# Patient Record
Sex: Female | Born: 1980 | Race: White | Hispanic: No | Marital: Single | Smoking: Never smoker
Health system: Southern US, Community
[De-identification: ages and names within clinical notes are randomized; demographics above are authoritative.]

## PROBLEM LIST (undated history)

## (undated) DIAGNOSIS — E7800 Pure hypercholesterolemia, unspecified: Secondary | ICD-10-CM

## (undated) DIAGNOSIS — I10 Essential (primary) hypertension: Secondary | ICD-10-CM

## (undated) DIAGNOSIS — E119 Type 2 diabetes mellitus without complications: Secondary | ICD-10-CM

## (undated) HISTORY — PX: CHOLECYSTECTOMY: SHX55

## (undated) HISTORY — PX: ABDOMINAL HYSTERECTOMY: SHX81

## (undated) HISTORY — PX: TONSILLECTOMY: SUR1361

## (undated) HISTORY — PX: APPENDECTOMY: SHX54

---

## 2015-09-21 ENCOUNTER — Emergency Department: Payer: Medicaid - Out of State

## 2015-09-21 ENCOUNTER — Emergency Department
Admission: EM | Admit: 2015-09-21 | Discharge: 2015-09-21 | Disposition: A | Discharge status: Home / Self Care | Payer: Medicaid - Out of State | Attending: Emergency Medicine | Admitting: Emergency Medicine

## 2015-09-21 ENCOUNTER — Encounter: Payer: Self-pay | Admitting: Emergency Medicine

## 2015-09-21 DIAGNOSIS — M549 Dorsalgia, unspecified: Secondary | ICD-10-CM | POA: Diagnosis present

## 2015-09-21 DIAGNOSIS — M79671 Pain in right foot: Secondary | ICD-10-CM | POA: Diagnosis not present

## 2015-09-21 DIAGNOSIS — I10 Essential (primary) hypertension: Secondary | ICD-10-CM | POA: Insufficient documentation

## 2015-09-21 DIAGNOSIS — M5410 Radiculopathy, site unspecified: Secondary | ICD-10-CM | POA: Diagnosis not present

## 2015-09-21 HISTORY — DX: Essential (primary) hypertension: I10

## 2015-09-21 LAB — URINALYSIS COMPLETE WITH MICROSCOPIC (ARMC ONLY)
Bilirubin Urine: NEGATIVE
GLUCOSE, UA: NEGATIVE mg/dL
Hgb urine dipstick: NEGATIVE
Ketones, ur: NEGATIVE mg/dL
Leukocytes, UA: NEGATIVE
Nitrite: NEGATIVE
Protein, ur: NEGATIVE mg/dL
Specific Gravity, Urine: 1.017 (ref 1.005–1.030)
pH: 7 (ref 5.0–8.0)

## 2015-09-21 MED ORDER — METHYLPREDNISOLONE 4 MG PO TBPK: ORAL_TABLET | ORAL | Status: AC

## 2015-09-21 MED ORDER — DEXAMETHASONE SODIUM PHOSPHATE 10 MG/ML IJ SOLN
10.0000 mg | Freq: Once | INTRAMUSCULAR | Status: AC
  Administered 2015-09-21: 10 mg via INTRAMUSCULAR
  Filled 2015-09-21: qty 1

## 2015-09-21 MED ORDER — METHOCARBAMOL 500 MG PO TABS
1000.0000 mg | ORAL_TABLET | Freq: Once | ORAL | Status: AC
  Administered 2015-09-21: 1000 mg via ORAL
  Filled 2015-09-21: qty 2

## 2015-09-21 MED ORDER — METHOCARBAMOL 750 MG PO TABS: 1500.0000 mg | ORAL_TABLET | Freq: Four times a day (QID) | ORAL | Status: AC

## 2015-09-21 MED ORDER — TRAMADOL HCL 50 MG PO TABS
50.0000 mg | ORAL_TABLET | Freq: Once | ORAL | Status: AC
  Administered 2015-09-21: 50 mg via ORAL
  Filled 2015-09-21: qty 1

## 2015-09-21 MED ORDER — TRAMADOL HCL 50 MG PO TABS: 50.0000 mg | ORAL_TABLET | Freq: Four times a day (QID) | ORAL | Status: DC | PRN

## 2015-09-21 NOTE — ED Provider Notes (Signed)
[facility] Emergency Department Provider Note   ____________________________________________  Time seen: Approximately 5:55 PM  I have reviewed the triage vital signs and the nursing notes.   HISTORY  Chief Complaint Back Pain    HPI Brenda Hansen is a 35 y.o. female patient complaining of Acute onset of radicular right back pain to the right lower extremity. Patient denies any provocative incident for this complaint. Patient denies any bladder or bowel dysfunction. Patient also complaining of right dorsal foot pain. Patient states she sprained her foot one month ago but the pain is not resolved. Patient's states x-rays taken of the right foot one month ago but it was perform in [place]. Patient rates overall pain as a 9/10. No palliative measures taken for this complaint.  Past Medical History  Diagnosis Date  . Hypertension     There are no active problems to display for this patient.   Past Surgical History  Procedure Laterality Date  . Abdominal hysterectomy    . Cholecystectomy    . Appendectomy      Current Outpatient Rx  Name  Route  Sig  Dispense  Refill  . methocarbamol (ROBAXIN-750) 750 MG tablet   Oral   Take 2 tablets (1,500 mg total) by mouth 4 (four) times daily.   40 tablet   0   . methylPREDNISolone (MEDROL DOSEPAK) 4 MG TBPK tablet      Take Tapered dose as directed   21 tablet   0   . traMADol (ULTRAM) 50 MG tablet   Oral   Take 1 tablet (50 mg total) by mouth every 6 (six) hours as needed.   20 tablet   0     Allergies Iodine; Nsaids; and Penicillins  No family history on file.  Social History Social History  Substance Use Topics  . Smoking status: Never Smoker   . Smokeless tobacco: None  . Alcohol Use: No    Review of Systems Constitutional: No fever/chills Eyes: No visual changes. ENT: No sore throat. Cardiovascular: Denies chest pain. Respiratory: Denies shortness of  breath. Gastrointestinal: No abdominal pain.  No nausea, no vomiting.  No diarrhea.  No constipation. Genitourinary: Negative for dysuria. Musculoskeletal:Positive for back pain and right dorsal foot pain Skin: Negative for rash. Neurological: Negative for headaches, focal weakness or numbness. Endocrine:Hypertension   ____________________________________________   PHYSICAL EXAM:  VITAL SIGNS: ED Triage Vitals  Enc Vitals Group     BP 09/21/15 1720 141/79 mmHg     Pulse Rate 09/21/15 1720 104     Resp 09/21/15 1720 20     Temp 09/21/15 1720 98.1 F (36.7 C)     Temp Source 09/21/15 1720 Oral     SpO2 09/21/15 1720 100 %     Weight 09/21/15 1720 210 lb (95.255 kg)     Height 09/21/15 1720  (1.676 m)     Head Cir --      Peak Flow --      Pain Score 09/21/15 1717 9     Pain Loc --      Pain Edu? --      Excl. in GC? --     Constitutional: Alert and oriented. Well appearing and in no acute distress. Eyes: Conjunctivae are normal. PERRL. EOMI. Head: Atraumatic. Nose: No congestion/rhinnorhea. Mouth/Throat: Mucous membranes are moist.  Oropharynx non-erythematous. Neck: No stridor.  No cervical spine tenderness to palpation. Hematological/Lymphatic/Immunilogical: No cervical lymphadenopathy. Cardiovascular:Mildly tachycardic. Grossly normal heart sounds.  Good peripheral  circulation. Respiratory: Normal respiratory effort.  No retractions. Lungs CTAB. Gastrointestinal: Soft and nontender. No distention. No abdominal bruits. No CVA tenderness. Musculoskeletal: No obvious spinal deformity. Patient is moderate guarding at L3-S1. Patient decreased range of motion with flexion. Patient has a positive right leg test. Examination of the right foot shows no obvious deformity. Patient has a very high arch. Patient has some moderate guarding palpation dorsal aspect of the right foot. There is no edema or erythema. Neurologic:  Normal speech and language. No gross focal neurologic  deficits are appreciated. No gait instability. Skin:  Skin is warm, dry and intact. No rash noted. Psychiatric: Mood and affect are normal. Speech and behavior are normal.  ____________________________________________   LABS (all labs ordered are listed, but only abnormal results are displayed)  Labs Reviewed  URINALYSIS COMPLETEWITH MICROSCOPIC ([facility] ONLY) - Abnormal; Notable for the following:    Color, Urine YELLOW (*)    APPearance CLOUDY (*)    Bacteria, UA RARE (*)    Squamous Epithelial / LPF 6-30 (*)    All other components within normal limits   ____________________________________________  EKG   ____________________________________________  RADIOLOGY  No acute findings on lumbar x-ray and right foot x-ray. ____________________________________________   PROCEDURES  Procedure(s) performed: None  Critical Care performed: No  ____________________________________________   INITIAL IMPRESSION / ASSESSMENT AND PLAN / ED COURSE  Pertinent labs & imaging results that were available during my care of the patient were reviewed by me and considered in my medical decision making (see chart for details).  Radicular back pain and right foot pain. Discussed x-ray findings and lab results with patient. Patient advised follow orthopedics for further evaluation and treatment. Patient given discharge Instructions. Patient given prescription for tramadol, Robaxin, and Medrol Dosepak. ____________________________________________   FINAL CLINICAL IMPRESSION(S) / ED DIAGNOSES  Final diagnoses:  Radicular low back pain  Foot pain, right      NEW MEDICATIONS STARTED DURING THIS VISIT:  New Prescriptions   METHOCARBAMOL (ROBAXIN-750) 750 MG TABLET    Take 2 tablets (1,500 mg total) by mouth 4 (four) times daily.   METHYLPREDNISOLONE (MEDROL DOSEPAK) 4 MG TBPK TABLET    Take Tapered dose as directed   TRAMADOL (ULTRAM) 50 MG TABLET    Take 1 tablet (50 mg total) by mouth  every 6 (six) hours as needed.     Note:  This document was prepared using Dragon voice recognition software and may include unintentional dictation errors.    Joni Reiningonald K Ione [place], PA-C 09/21/15 1940  Sharyn CreamerMark Quale, MD 09/22/15 0010

## 2015-09-21 NOTE — ED Notes (Signed)
Right sided lower back pain which radiates into right leg /foot   Denies any injury  Denies any urinary sx's

## 2015-10-02 ENCOUNTER — Emergency Department (HOSPITAL_COMMUNITY): Payer: Self-pay

## 2015-10-02 ENCOUNTER — Encounter (HOSPITAL_COMMUNITY): Payer: Self-pay | Admitting: Emergency Medicine

## 2015-10-02 ENCOUNTER — Emergency Department (HOSPITAL_COMMUNITY)
Admission: EM | Admit: 2015-10-02 | Discharge: 2015-10-03 | Disposition: A | Discharge status: Home / Self Care | Payer: Self-pay | Attending: Emergency Medicine | Admitting: Emergency Medicine

## 2015-10-02 DIAGNOSIS — Z79899 Other long term (current) drug therapy: Secondary | ICD-10-CM | POA: Insufficient documentation

## 2015-10-02 DIAGNOSIS — Z791 Long term (current) use of non-steroidal anti-inflammatories (NSAID): Secondary | ICD-10-CM | POA: Insufficient documentation

## 2015-10-02 DIAGNOSIS — I10 Essential (primary) hypertension: Secondary | ICD-10-CM | POA: Insufficient documentation

## 2015-10-02 DIAGNOSIS — M545 Low back pain: Secondary | ICD-10-CM | POA: Insufficient documentation

## 2015-10-02 DIAGNOSIS — W19XXXA Unspecified fall, initial encounter: Secondary | ICD-10-CM

## 2015-10-02 DIAGNOSIS — Y939 Activity, unspecified: Secondary | ICD-10-CM | POA: Insufficient documentation

## 2015-10-02 DIAGNOSIS — Y9289 Other specified places as the place of occurrence of the external cause: Secondary | ICD-10-CM | POA: Insufficient documentation

## 2015-10-02 DIAGNOSIS — M542 Cervicalgia: Secondary | ICD-10-CM | POA: Insufficient documentation

## 2015-10-02 DIAGNOSIS — W1789XA Other fall from one level to another, initial encounter: Secondary | ICD-10-CM | POA: Insufficient documentation

## 2015-10-02 DIAGNOSIS — Y999 Unspecified external cause status: Secondary | ICD-10-CM | POA: Insufficient documentation

## 2015-10-02 MED ORDER — HYDROCODONE-ACETAMINOPHEN 5-325 MG PO TABS
1.0000 | ORAL_TABLET | Freq: Once | ORAL | Status: AC
  Administered 2015-10-02: 1 via ORAL
  Filled 2015-10-02: qty 1

## 2015-10-02 NOTE — ED Notes (Addendum)
Pt fell from building site this afternoon. Landed on concrete. Denies LOC or hitting head. Landed on R side of back, R arm, and R leg. Reports difficulty breathing at times. Pain with movement, no obvious deformities.

## 2015-10-02 NOTE — ED Provider Notes (Signed)
CSN: [id number]     Arrival date & time 10/02/15  1808 History   First MD Initiated Contact with Patient 10/02/15 2052     Chief Complaint  Patient presents with  . Fall     (Consider location/radiation/quality/duration/timing/severity/associated sxs/prior Treatment) HPI  Larey Seat 10-12 feet couple hours prior to arrival landing on back and hit her head and is not complaining of back pain head pain and neck pain. No loss of consciousness. No Other associated symptoms at this time.  History reviewed. No pertinent past medical history. History reviewed. No pertinent past surgical history. History reviewed. No pertinent family history. Social History  Substance Use Topics  . Smoking status: Never Smoker   . Smokeless tobacco: None  . Alcohol Use: No   OB History    No data available     Review of Systems  Constitutional: Negative for fever.  HENT: Negative for congestion and facial swelling.   Eyes: Negative for discharge and redness.  Respiratory: Negative for cough and shortness of breath.   Cardiovascular: Negative for chest pain.  Gastrointestinal: Negative for abdominal pain and abdominal distention.  Endocrine: Negative for polydipsia.  Genitourinary: Negative for dysuria.  Musculoskeletal: Positive for back pain and neck pain.  Skin: Negative for wound.  Neurological: Positive for headaches.  All other systems reviewed and are negative.     Allergies  Iodine; Nsaids; and Penicillins  Home Medications   Prior to Admission medications   Medication Sig Start Date End Date Taking? Authorizing Provider  acetaminophen (TYLENOL) 500 MG tablet Take 1,000 mg by mouth every 6 (six) hours as needed for moderate pain.   Yes Historical Provider, MD  pantoprazole (PROTONIX) 40 MG tablet Take 40 mg by mouth daily.   Yes Historical Provider, MD  PROVENTIL HFA 108 (90 Base) MCG/ACT inhaler Inhale 2 puffs into the lungs every 4 (four) hours as needed for wheezing or shortness of  breath.  09/16/15  Yes Historical Provider, MD  QVAR 40 MCG/ACT inhaler Inhale 1 puff into the lungs 2 (two) times daily as needed (shortness of breathe.).  09/16/15  Yes Historical Provider, MD   BP 131/93 mmHg  Pulse 78  Temp(Src) 97.9 F (36.6 C) (Oral)  Resp 20  SpO2 100% Physical Exam  Constitutional: She is oriented to person, place, and time. She appears well-developed and well-nourished.  HENT:  Head: Normocephalic and atraumatic.  Eyes: Pupils are equal, round, and reactive to light.  Neck: Normal range of motion.  Cardiovascular: Normal rate and regular rhythm.   Pulmonary/Chest: No stridor. No respiratory distress.  Abdominal: Soft. She exhibits no distension. There is no tenderness.  Musculoskeletal: Normal range of motion. She exhibits no edema or tenderness.  Neurological: She is alert and oriented to person, place, and time. No cranial nerve deficit. Coordination normal.  Skin: Skin is warm and dry.  Nursing note and vitals reviewed.   ED Course  Procedures (including critical care time) Labs Review Labs Reviewed - No data to display  Imaging Review Dg Thoracic Spine 2 View  10/02/2015  CLINICAL DATA:  Patient fell two stories at a construction site, states she landed on her back, upper back pain that radiates to lower back and hips EXAM: THORACIC SPINE 2 VIEWS COMPARISON:  None. FINDINGS: No fracture. No spondylolisthesis. Minor endplate spurring along the mid to lower thoracic spine. No other degenerative change. No bone lesion. Normal soft tissues. IMPRESSION: No fracture or acute finding. Electronically Signed   By: Renard Hamper.D.  On: 10/02/2015 21:43   Dg Lumbar Spine Complete  10/02/2015  CLINICAL DATA:  Patient fell two stories at a construction site, states she landed on her back, upper back pain that radiates to lower back and hips EXAM: LUMBAR SPINE - COMPLETE 4+ VIEW COMPARISON:  None. FINDINGS: There is no evidence of lumbar spine fracture. Alignment is  normal. Mild narrowing of the L4-5 interspace. Cholecystectomy clips. Bilateral pelvic phleboliths. IMPRESSION: 1. Negative for fracture or dislocation. 2. Narrowing of the L4-5 interspace, of uncertain chronicity. Electronically Signed   By: Corlis Leak M.D.   On: 10/02/2015 21:43   Dg Pelvis 1-2 Views  10/02/2015  CLINICAL DATA:  Patient fell two stories at a construction site, states she landed on her back, upper back pain that radiates to lower back and hips EXAM: PELVIS - 1-2 VIEW COMPARISON:  None. FINDINGS: There is no evidence of pelvic fracture or diastasis. No pelvic bone lesions are seen. IMPRESSION: Negative. Electronically Signed   By: Amie Portland M.D.   On: 10/02/2015 21:44   Ct Head Wo Contrast  10/02/2015  CLINICAL DATA:  Pt fell from building site this afternoon. Landed on concrete. Denies LOC or hitting head. Landed on R side of back, R arm, and R leg. Reports difficulty breathing at times. Pain with movement, no obvious deformities. Pt unable to remove tongue ring EXAM: CT HEAD WITHOUT CONTRAST CT CERVICAL SPINE WITHOUT CONTRAST TECHNIQUE: Multidetector CT imaging of the head and cervical spine was performed following the standard protocol without intravenous contrast. Multiplanar CT image reconstructions of the cervical spine were also generated. COMPARISON:  None. FINDINGS: CT HEAD FINDINGS Brain: No evidence of acute infarction, hemorrhage, extra-axial collection, ventriculomegaly, or mass effect. Vascular: No hyperdense vessel or unexpected calcification. Skull: Negative for fracture or focal lesion. Sinuses/Orbits: No acute findings. Other: None. CT CERVICAL SPINE FINDINGS Negative for fracture. Vertebral body and disc height maintained throughout. Calcifications in the posterior longitudinal ligament at C3-4 and C4-5. No osseous foraminal stenosis. No prevertebral soft tissue swelling. Normal alignment. Visualized lung apices clear. IMPRESSION: 1. Negative for bleed or other acute  intracranial process. 2. No acute cervical abnormality. Electronically Signed   By: Corlis Leak M.D.   On: 10/02/2015 22:04   Ct Cervical Spine Wo Contrast  10/02/2015  CLINICAL DATA:  Pt fell from building site this afternoon. Landed on concrete. Denies LOC or hitting head. Landed on R side of back, R arm, and R leg. Reports difficulty breathing at times. Pain with movement, no obvious deformities. Pt unable to remove tongue ring EXAM: CT HEAD WITHOUT CONTRAST CT CERVICAL SPINE WITHOUT CONTRAST TECHNIQUE: Multidetector CT imaging of the head and cervical spine was performed following the standard protocol without intravenous contrast. Multiplanar CT image reconstructions of the cervical spine were also generated. COMPARISON:  None. FINDINGS: CT HEAD FINDINGS Brain: No evidence of acute infarction, hemorrhage, extra-axial collection, ventriculomegaly, or mass effect. Vascular: No hyperdense vessel or unexpected calcification. Skull: Negative for fracture or focal lesion. Sinuses/Orbits: No acute findings. Other: None. CT CERVICAL SPINE FINDINGS Negative for fracture. Vertebral body and disc height maintained throughout. Calcifications in the posterior longitudinal ligament at C3-4 and C4-5. No osseous foraminal stenosis. No prevertebral soft tissue swelling. Normal alignment. Visualized lung apices clear. IMPRESSION: 1. Negative for bleed or other acute intracranial process. 2. No acute cervical abnormality. Electronically Signed   By: Corlis Leak M.D.   On: 10/02/2015 22:04   I have personally reviewed and evaluated these images and lab results  as part of my medical decision-making.   EKG Interpretation None      MDM   Final diagnoses:  Fall, initial encounter    89 female with a fall without any obvious injuries on exam or imaging.   Plan for DC on NSAIDs.  New Prescriptions: Discharge Medication List as of 10/02/2015 11:13 PM       I have personally and contemperaneously reviewed labs and  imaging and used in my decision making as above.   A medical screening exam was performed and I feel the patient has had an appropriate workup for their chief complaint at this time and likelihood of emergent condition existing is low and thus workup can continue on an outpatient basis.. Their vital signs are stable. They have been counseled on decision, discharge, follow up and which symptoms necessitate immediate return to the emergency department.  They verbally stated understanding and agreement with plan and discharged in stable condition.      Marily MemosJason Joleigh Mineau, MD 10/03/15 [phone]

## 2015-10-03 ENCOUNTER — Encounter: Payer: Self-pay | Admitting: Emergency Medicine

## 2015-10-09 ENCOUNTER — Encounter (HOSPITAL_COMMUNITY): Payer: Self-pay | Admitting: Emergency Medicine

## 2015-10-09 ENCOUNTER — Emergency Department (HOSPITAL_COMMUNITY)
Admission: EM | Admit: 2015-10-09 | Discharge: 2015-10-10 | Disposition: A | Discharge status: Home / Self Care | Payer: Medicaid - Out of State | Attending: Emergency Medicine | Admitting: Emergency Medicine

## 2015-10-09 DIAGNOSIS — R1012 Left upper quadrant pain: Secondary | ICD-10-CM | POA: Diagnosis not present

## 2015-10-09 DIAGNOSIS — Z79899 Other long term (current) drug therapy: Secondary | ICD-10-CM | POA: Diagnosis not present

## 2015-10-09 DIAGNOSIS — I10 Essential (primary) hypertension: Secondary | ICD-10-CM | POA: Diagnosis not present

## 2015-10-09 DIAGNOSIS — R109 Unspecified abdominal pain: Secondary | ICD-10-CM | POA: Diagnosis present

## 2015-10-09 DIAGNOSIS — E119 Type 2 diabetes mellitus without complications: Secondary | ICD-10-CM | POA: Insufficient documentation

## 2015-10-09 HISTORY — DX: Type 2 diabetes mellitus without complications: E11.9

## 2015-10-09 LAB — CBC
HCT: 38.2 % (ref 36.0–46.0)
Hemoglobin: 12.2 g/dL (ref 12.0–15.0)
MCH: 29 pg (ref 26.0–34.0)
MCHC: 31.9 g/dL (ref 30.0–36.0)
MCV: 91 fL (ref 78.0–100.0)
Platelets: 427 10*3/uL — ABNORMAL HIGH (ref 150–400)
RBC: 4.2 MIL/uL (ref 3.87–5.11)
RDW: 13.2 % (ref 11.5–15.5)
WBC: 12.1 10*3/uL — AB (ref 4.0–10.5)

## 2015-10-09 LAB — COMPREHENSIVE METABOLIC PANEL
ALBUMIN: 3.9 g/dL (ref 3.5–5.0)
ALK PHOS: 75 U/L (ref 38–126)
ALT: 20 U/L (ref 14–54)
ANION GAP: 7 (ref 5–15)
AST: 23 U/L (ref 15–41)
BILIRUBIN TOTAL: 0.4 mg/dL (ref 0.3–1.2)
BUN: 19 mg/dL (ref 6–20)
CALCIUM: 9.3 mg/dL (ref 8.9–10.3)
CO2: 23 mmol/L (ref 22–32)
Chloride: 108 mmol/L (ref 101–111)
Creatinine, Ser: 1.1 mg/dL — ABNORMAL HIGH (ref 0.44–1.00)
GFR calc Af Amer: >60 mL/min (ref >60)
GLUCOSE: 125 mg/dL — AB (ref 65–99)
Potassium: 3.5 mmol/L (ref 3.5–5.1)
Sodium: 138 mmol/L (ref 135–145)
TOTAL PROTEIN: 6.9 g/dL (ref 6.5–8.1)

## 2015-10-09 LAB — URINALYSIS, ROUTINE W REFLEX MICROSCOPIC
Glucose, UA: NEGATIVE mg/dL
Hgb urine dipstick: NEGATIVE
KETONES UR: 15 mg/dL — AB
Leukocytes, UA: NEGATIVE
NITRITE: NEGATIVE
PROTEIN: NEGATIVE mg/dL
Specific Gravity, Urine: 1.037 — ABNORMAL HIGH (ref 1.005–1.030)
pH: 5.5 (ref 5.0–8.0)

## 2015-10-09 LAB — LIPASE, BLOOD: Lipase: 32 U/L (ref 11–51)

## 2015-10-09 LAB — HCG, QUANTITATIVE, PREGNANCY

## 2015-10-09 MED ORDER — MORPHINE SULFATE (PF) 4 MG/ML IV SOLN
4.0000 mg | Freq: Once | INTRAVENOUS | Status: AC
  Administered 2015-10-10: 4 mg via INTRAVENOUS
  Filled 2015-10-09: qty 1

## 2015-10-09 MED ORDER — ONDANSETRON HCL 4 MG/2ML IJ SOLN
4.0000 mg | Freq: Once | INTRAMUSCULAR | Status: AC
  Administered 2015-10-10: 4 mg via INTRAVENOUS
  Filled 2015-10-09: qty 2

## 2015-10-09 MED ORDER — SODIUM CHLORIDE 0.9 % IV BOLUS (SEPSIS)
1000.0000 mL | Freq: Once | INTRAVENOUS | Status: AC
  Administered 2015-10-10: 1000 mL via INTRAVENOUS

## 2015-10-09 NOTE — ED Notes (Signed)
Pt states this am she started having some LLQ pain with vomiting x2 and diarrhea. Pt also c/o of left knee pain denies any injury.

## 2015-10-10 ENCOUNTER — Emergency Department (HOSPITAL_COMMUNITY): Payer: Medicaid - Out of State

## 2015-10-10 ENCOUNTER — Encounter (HOSPITAL_COMMUNITY): Payer: Self-pay | Admitting: Radiology

## 2015-10-10 MED ORDER — IOPAMIDOL (ISOVUE-300) INJECTION 61%
INTRAVENOUS | Status: AC
  Administered 2015-10-10: 100 mL
  Filled 2015-10-10: qty 100

## 2015-10-10 MED ORDER — TRAMADOL HCL 50 MG PO TABS: 50.0000 mg | ORAL_TABLET | Freq: Four times a day (QID) | ORAL | Status: DC | PRN

## 2015-10-10 MED ORDER — PROMETHAZINE HCL 25 MG PO TABS: 25.0000 mg | ORAL_TABLET | Freq: Three times a day (TID) | ORAL | Status: AC | PRN

## 2015-10-10 NOTE — ED Provider Notes (Signed)
CSN: [id number]     Arrival date & time 10/09/15  2111 History   First MD Initiated Contact with Patient 10/09/15 2315     Chief Complaint  Patient presents with  . Abdominal Pain     (Consider location/radiation/quality/duration/timing/severity/associated sxs/prior Treatment) HPI Patient presents to the emergency department with left-sided abdominal pain that started 2 days ago.  The patient states that nausea but no vomiting.  Patient states that seems make the condition better or worse.  She states that she has had similar pain in the past and it feels like pancreatitisThe patient denies chest pain, shortness of breath, headache,blurred vision, neck pain, fever, cough, weakness, numbness, dizziness, anorexia, edema,  vomiting, diarrhea, rash, back pain, dysuria, hematemesis, bloody stool, near syncope, or syncope. Past Medical History  Diagnosis Date  . Hypertension   . Diabetes mellitus without complication [facility](HCC)    Past Surgical History  Procedure Laterality Date  . Abdominal hysterectomy    . Cholecystectomy    . Appendectomy     No family history on file. Social History  Substance Use Topics  . Smoking status: Never Smoker   . Smokeless tobacco: None  . Alcohol Use: No   OB History    Gravida Para Term Preterm AB TAB SAB Ectopic Multiple Living   0 0 0 0 0 0 0 0       Review of Systems  All other systems negative except as documented in the HPI. All pertinent positives and negatives as reviewed in the HPI.  Allergies  Iodine; Iodine; Nsaids; Nsaids; Penicillins; and Penicillins  Home Medications   Prior to Admission medications   Medication Sig Start Date End Date Taking? Authorizing Provider  methocarbamol (ROBAXIN-750) 750 MG tablet Take 2 tablets (1,500 mg total) by mouth 4 (four) times daily. Patient not taking: Reported on 10/09/2015 09/21/15   Joni Reiningonald K Smith, PA-C  methylPREDNISolone (MEDROL DOSEPAK) 4 MG TBPK tablet Take Tapered dose as directed Patient  not taking: Reported on 10/09/2015 09/21/15   Joni Reiningonald K Smith, PA-C  pantoprazole (PROTONIX) 40 MG tablet Take 40 mg by mouth daily.   Yes Historical Provider, MD  traMADol (ULTRAM) 50 MG tablet Take 1 tablet (50 mg total) by mouth every 6 (six) hours as needed. Patient not taking: Reported on 10/09/2015 09/21/15 09/20/16  Joni Reiningonald K Smith, PA-C   BP 107/79 mmHg  Pulse 75  Temp(Src) 98.1 F (36.7 C) (Oral)  Resp 20  Ht 5\' 6"  (1.676 m)  Wt 99.933 kg  BMI 35.58 kg/m2  SpO2 99% Physical Exam  Constitutional: She is oriented to person, place, and time. She appears well-developed and well-nourished. No distress.  HENT:  Head: Normocephalic and atraumatic.  Mouth/Throat: Oropharynx is clear and moist.  Eyes: Pupils are equal, round, and reactive to light.  Neck: Normal range of motion. Neck supple.  Cardiovascular: Normal rate, regular rhythm and normal heart sounds.  Exam reveals no gallop and no friction rub.   No murmur heard. Pulmonary/Chest: Effort normal and breath sounds normal. No respiratory distress. She has no wheezes.  Abdominal: Soft. Bowel sounds are normal. She exhibits no distension. There is tenderness. There is no rebound and no guarding.  Neurological: She is alert and oriented to person, place, and time. She exhibits normal muscle tone. Coordination normal.  Skin: Skin is warm and dry. No rash noted. No erythema.  Psychiatric: She has a normal mood and affect. Her behavior is normal.  Nursing note and vitals reviewed.   ED Course  Procedures (including critical care time) Labs Review Labs Reviewed  COMPREHENSIVE METABOLIC PANEL - Abnormal; Notable for the following:    Glucose, Bld 125 (*)    Creatinine, Ser 1.10 (*)    All other components within normal limits  CBC - Abnormal; Notable for the following:    WBC 12.1 (*)    Platelets 427 (*)    All other components within normal limits  URINALYSIS, ROUTINE W REFLEX MICROSCOPIC (NOT AT [facility]) - Abnormal; Notable for the  following:    APPearance CLOUDY (*)    Specific Gravity, Urine 1.037 (*)    Bilirubin Urine SMALL (*)    Ketones, ur 15 (*)    All other components within normal limits  LIPASE, BLOOD  HCG, QUANTITATIVE, PREGNANCY    Imaging Review Ct Abdomen Pelvis W Contrast  10/10/2015  CLINICAL DATA:  Left lower quadrant pain, onset this morning. EXAM: CT ABDOMEN AND PELVIS WITH CONTRAST TECHNIQUE: Multidetector CT imaging of the abdomen and pelvis was performed using the standard protocol following bolus administration of intravenous contrast. CONTRAST:  100mL ISOVUE-300 IOPAMIDOL (ISOVUE-300) INJECTION 61% COMPARISON:  None. FINDINGS: Lower chest: Minimal atelectatic appearing posterior lung base opacities bilaterally. Hepatobiliary: Prior cholecystectomy. Unremarkable liver and bile ducts. Pancreas: Normal Spleen: Normal Adrenals/Urinary Tract: Focal 9 mm hypodensity of the right renal midpole anteriorly likely represents a benign cyst. No suspicious renal parenchymal lesions. 2 mm calculus of the left lower pole collecting system. Ureters and urinary bladder are unremarkable. Both adrenals are normal. Stomach/Bowel: Probable appendectomy. Stomach, small bowel and colon are otherwise unremarkable. Vascular/Lymphatic: The abdominal aorta is normal in caliber. There is no atherosclerotic calcification. There is no adenopathy in the abdomen or pelvis. Reproductive: Hysterectomy.  No adnexal abnormalities. Other: No acute inflammatory changes in the abdomen or pelvis. No ascites. Musculoskeletal: No significant skeletal lesion. IMPRESSION: 1. Left nephrolithiasis. 2. No acute findings are evident in the abdomen or pelvis. Electronically Signed   By: Ellery Plunkaniel R Mitchell M.D.   On: 10/10/2015 04:45   I have personally reviewed and evaluated these images and lab results as part of my medical decision-making.    The patient's CT scan does not show any significant abnormality.  The patient is feeling some better  following IV fluids and medications.  She will be referred back to her primary care Dr. told to return here as needed.  Patient agrees the plan and all questions were answered.  A set her testing, there is not a specific significant cause of her pain.  Patient is stable and her vital signs have remained within normal limits  Charlestine NightChristopher Tiant [place], PA-C 10/10/15 [phone]  Dione Boozeavid Glick, MD 10/10/15 [phone]

## 2015-10-10 NOTE — Discharge Instructions (Signed)
Return here as needed.  Follow-up with a primary care doctor or an urgent care

## 2015-10-17 ENCOUNTER — Encounter (HOSPITAL_COMMUNITY): Payer: Self-pay

## 2015-10-17 ENCOUNTER — Emergency Department (HOSPITAL_COMMUNITY)
Admission: EM | Admit: 2015-10-17 | Discharge: 2015-10-17 | Disposition: A | Discharge status: Home / Self Care | Payer: Medicaid - Out of State | Attending: Emergency Medicine | Admitting: Emergency Medicine

## 2015-10-17 DIAGNOSIS — E119 Type 2 diabetes mellitus without complications: Secondary | ICD-10-CM | POA: Insufficient documentation

## 2015-10-17 DIAGNOSIS — I10 Essential (primary) hypertension: Secondary | ICD-10-CM | POA: Insufficient documentation

## 2015-10-17 DIAGNOSIS — R1032 Left lower quadrant pain: Secondary | ICD-10-CM | POA: Insufficient documentation

## 2015-10-17 DIAGNOSIS — Z79899 Other long term (current) drug therapy: Secondary | ICD-10-CM | POA: Insufficient documentation

## 2015-10-17 DIAGNOSIS — Z7984 Long term (current) use of oral hypoglycemic drugs: Secondary | ICD-10-CM | POA: Insufficient documentation

## 2015-10-17 HISTORY — DX: Pure hypercholesterolemia, unspecified: E78.00

## 2015-10-17 LAB — URINALYSIS, ROUTINE W REFLEX MICROSCOPIC
Bilirubin Urine: NEGATIVE
GLUCOSE, UA: NEGATIVE mg/dL
HGB URINE DIPSTICK: NEGATIVE
Ketones, ur: NEGATIVE mg/dL
LEUKOCYTES UA: NEGATIVE
Nitrite: NEGATIVE
PH: 6 (ref 5.0–8.0)
Protein, ur: NEGATIVE mg/dL
SPECIFIC GRAVITY, URINE: 1.028 (ref 1.005–1.030)

## 2015-10-17 LAB — COMPREHENSIVE METABOLIC PANEL
ALT: 20 U/L (ref 14–54)
AST: 18 U/L (ref 15–41)
Albumin: 4.1 g/dL (ref 3.5–5.0)
Alkaline Phosphatase: 70 U/L (ref 38–126)
Anion gap: 8 (ref 5–15)
BUN: 19 mg/dL (ref 6–20)
CHLORIDE: 111 mmol/L (ref 101–111)
CO2: 20 mmol/L — AB (ref 22–32)
Calcium: 8.9 mg/dL (ref 8.9–10.3)
Creatinine, Ser: 0.93 mg/dL (ref 0.44–1.00)
Glucose, Bld: 112 mg/dL — ABNORMAL HIGH (ref 65–99)
POTASSIUM: 3.9 mmol/L (ref 3.5–5.1)
SODIUM: 139 mmol/L (ref 135–145)
Total Bilirubin: 0.2 mg/dL — ABNORMAL LOW (ref 0.3–1.2)
Total Protein: 7.4 g/dL (ref 6.5–8.1)

## 2015-10-17 LAB — CBC
HEMATOCRIT: 39.1 % (ref 36.0–46.0)
Hemoglobin: 12.5 g/dL (ref 12.0–15.0)
MCH: 29.5 pg (ref 26.0–34.0)
MCHC: 32 g/dL (ref 30.0–36.0)
MCV: 92.2 fL (ref 78.0–100.0)
Platelets: 405 10*3/uL — ABNORMAL HIGH (ref 150–400)
RBC: 4.24 MIL/uL (ref 3.87–5.11)
RDW: 13.8 % (ref 11.5–15.5)
WBC: 8.6 10*3/uL (ref 4.0–10.5)

## 2015-10-17 LAB — LIPASE, BLOOD: LIPASE: 46 U/L (ref 11–51)

## 2015-10-17 MED ORDER — ONDANSETRON HCL 4 MG PO TABS: 4.0000 mg | ORAL_TABLET | Freq: Four times a day (QID) | ORAL | Status: AC

## 2015-10-17 NOTE — ED Notes (Signed)
Pt ambulatory and independent at discharge.  

## 2015-10-17 NOTE — Discharge Instructions (Signed)
Please follow-up with primary care provider for further evaluation and management. Please return immediately if any new or worsening signs or symptoms present. Abdominal Pain, Adult Many things can cause abdominal pain. Usually, abdominal pain is not caused by a disease and will improve without treatment. It can often be observed and treated at home. Your health care provider will do a physical exam and possibly order blood tests and X-rays to help determine the seriousness of your pain. However, in many cases, more time must pass before a clear cause of the pain can be found. Before that point, your health care provider may not know if you need more testing or further treatment. HOME CARE INSTRUCTIONS Monitor your abdominal pain for any changes. The following actions may help to alleviate any discomfort you are experiencing:  Only take over-the-counter or prescription medicines as directed by your health care provider.  Do not take laxatives unless directed to do so by your health care provider.  Try a clear liquid diet (broth, tea, or water) as directed by your health care provider. Slowly move to a bland diet as tolerated. SEEK MEDICAL CARE IF:  You have unexplained abdominal pain.  You have abdominal pain associated with nausea or diarrhea.  You have pain when you urinate or have a bowel movement.  You experience abdominal pain that wakes you in the night.  You have abdominal pain that is worsened or improved by eating food.  You have abdominal pain that is worsened with eating fatty foods.  You have a fever. SEEK IMMEDIATE MEDICAL CARE IF:  Your pain does not go away within 2 hours.  You keep throwing up (vomiting).  Your pain is felt only in portions of the abdomen, such as the right side or the left lower portion of the abdomen.  You pass bloody or black tarry stools. MAKE SURE YOU:  Understand these instructions.  Will watch your condition.  Will get help right away  if you are not doing well or get worse.   This information is not intended to replace advice given to you by your health care provider. Make sure you discuss any questions you have with your health care provider.   Document Released: 01/07/2005 Document Revised: 12/19/2014 Document Reviewed: 12/07/2012 Elsevier Interactive Patient Education [employer]2016 [employer]

## 2015-10-17 NOTE — ED Notes (Signed)
Pt reports that she has to self-cath.  Pt provided a female catheter kit.

## 2015-10-17 NOTE — Progress Notes (Signed)
[facility] to speak to patient regarding pcp issues.  Patient noted to be seen in the ED 4 times this month.  EDCM spoke to patient at bedside. Patient confirms she does [place] have a pcp or [place].  [facility], informed patient of services there and walk in times.  EDCM also provided patient with list of pcps who accept self pay patients, list of discount pharmacies and websites needymeds.org and GoodRX.com for medication assistance, phone number to inquire about the orange card, phone number to inquire about Medicaid, phone number to inquire about the Affordable Care Act, financial resources in the community such as local churches, salvation [employer], urban ministries, and dental assistance for uninsured patients.   No further EDCM needs at this time.  Patient listed as having Medicaid out of state insurance.  EDCM instructed patient to contact DSS of [place] to enroll for Medicaid of [place].  [facility].  Discussed patient with EDPA.

## 2015-10-17 NOTE — ED Provider Notes (Signed)
CSN: [id number]     Arrival date & time 10/17/15  1614 History   First MD Initiated Contact with Patient 10/17/15 1817     Chief Complaint  Patient presents with  . Abdominal Pain  . Emesis     HPI   35 year old female presents today with complaints of left lower quadrant abdominal pain. Patient reports symptoms started 2 days ago, reports sharp in nature. Patient reports small amount of nausea and diarrhea. Patient reports any fever, chills, severe abdominal pain, vaginal discharge or bleeding. Patient denies any prior history of abdominal pain( was seen several days prior for abdominal pain). No medications prior to arrival.  Past Medical History  Diagnosis Date  . Hypertension   . Diabetes mellitus without complication (HCC)   . High cholesterol    Past Surgical History  Procedure Laterality Date  . Abdominal hysterectomy    . Cholecystectomy    . Appendectomy    . Tonsillectomy     History reviewed. No pertinent family history. Social History  Substance Use Topics  . Smoking status: Never Smoker   . Smokeless tobacco: None  . Alcohol Use: No   OB History    Gravida Para Term Preterm AB TAB SAB Ectopic Multiple Living   0 0 0 0 0 0 0 0       Review of Systems  All other systems reviewed and are negative.   Allergies  Iodine; Penicillins; Iodine; Nsaids; Nsaids; and Penicillins  Home Medications   Prior to Admission medications   Medication Sig Start Date End Date Taking? Authorizing Provider  albuterol (PROVENTIL HFA;VENTOLIN HFA) 108 (90 Base) MCG/ACT inhaler Inhale 2 puffs into the lungs every 4 (four) hours as needed for wheezing or shortness of breath.   Yes Historical Provider, MD  beclomethasone (QVAR) 40 MCG/ACT inhaler Inhale 2 puffs into the lungs 2 (two) times daily.   Yes Historical Provider, MD  diazepam (VALIUM) 10 MG tablet Place 10 mg vaginally at bedtime as needed (bladder spasms.).   Yes Historical Provider, MD  dicyclomine (BENTYL) 20 MG tablet  Take 20 mg by mouth 3 (three) times daily before meals.   Yes Historical Provider, MD  EPINEPHrine (EPIPEN 2-PAK) 0.3 mg/0.3 mL IJ SOAJ injection Inject 0.3 mg into the muscle once.   Yes Historical Provider, MD  famotidine (PEPCID) 40 MG tablet Take 40 mg by mouth daily.   Yes Historical Provider, MD  flavoxATE (URISPAS) 100 MG tablet Take 100 mg by mouth 3 (three) times daily.   Yes Historical Provider, MD  fluticasone (FLONASE) 50 MCG/ACT nasal spray Place 1 spray into both nostrils daily.   Yes Historical Provider, MD  gabapentin (NEURONTIN) 300 MG capsule Take 600 mg by mouth 3 (three) times daily.   Yes Historical Provider, MD  hydrochlorothiazide (HYDRODIURIL) 25 MG tablet Take 25 mg by mouth daily.   Yes Historical Provider, MD  hydrOXYzine (ATARAX/VISTARIL) 25 MG tablet Take 25 mg by mouth at bedtime as needed for anxiety.   Yes Historical Provider, MD  lipase/protease/amylase (CREON) 36000 UNITS CPEP capsule Take 36,000 Units by mouth 3 (three) times daily with meals.   Yes Historical Provider, MD  loratadine (CLARITIN) 10 MG tablet Take 10 mg by mouth 2 (two) times daily.   Yes Historical Provider, MD  losartan (COZAAR) 100 MG tablet Take 100 mg by mouth daily.   Yes Historical Provider, MD  meclizine (ANTIVERT) 25 MG tablet Take 25 mg by mouth 3 (three) times daily.   Yes Historical  Provider, MD  metFORMIN (GLUCOPHAGE) 1000 MG tablet Take 1,000 mg by mouth 2 (two) times daily with a meal.   Yes Historical Provider, MD  metoprolol succinate (TOPROL-XL) 100 MG 24 hr tablet Take 150 mg by mouth 2 (two) times daily. Take with or immediately following a meal.   Yes Historical Provider, MD  nortriptyline (PAMELOR) 25 MG capsule Take 25 mg by mouth at bedtime.   Yes Historical Provider, MD  omeprazole (PRILOSEC) 20 MG capsule Take 20 mg by mouth daily.   Yes Historical Provider, MD  pantoprazole (PROTONIX) 40 MG tablet Take 40 mg by mouth daily.   Yes Historical Provider, MD  promethazine  (PHENERGAN) 25 MG tablet Take 1 tablet (25 mg total) by mouth every 8 (eight) hours as needed for nausea or vomiting. 10/10/15  Yes Charlestine Nighthristopher Lawyer, PA-C  simvastatin (ZOCOR) 40 MG tablet Take 40 mg by mouth daily at 6 PM.   Yes Historical Provider, MD  sucralfate (CARAFATE) 1 g tablet Take 1 g by mouth 4 (four) times daily -  with meals and at bedtime.   Yes Historical Provider, MD  sulfamethoxazole-trimethoprim (BACTRIM DS,SEPTRA DS) 800-160 MG tablet Take 1 tablet by mouth 2 (two) times daily. For 14 days, then off 14 days.  Repeat as directed.   Yes Historical Provider, MD  tiotropium (SPIRIVA) 18 MCG inhalation capsule Place 18 mcg into inhaler and inhale daily.   Yes Historical Provider, MD  methocarbamol (ROBAXIN-750) 750 MG tablet Take 2 tablets (1,500 mg total) by mouth 4 (four) times daily. Patient not taking: Reported on 10/09/2015 09/21/15   Joni Reiningonald K Smith, PA-C  methylPREDNISolone (MEDROL DOSEPAK) 4 MG TBPK tablet Take Tapered dose as directed Patient not taking: Reported on 10/09/2015 09/21/15   Joni Reiningonald K Smith, PA-C  ondansetron (ZOFRAN) 4 MG tablet Take 1 tablet (4 mg total) by mouth every 6 (six) hours. 10/17/15   Eyvonne MechanicJeffrey Keahi Mccarney, PA-C  traMADol (ULTRAM) 50 MG tablet Take 1 tablet (50 mg total) by mouth every 6 (six) hours as needed for severe pain. Patient not taking: Reported on 10/17/2015 10/10/15   Charlestine Nighthristopher Lawyer, PA-C   BP 131/90 mmHg  Pulse 88  Temp(Src) 98.7 F (37.1 C) (Oral)  Resp 17  Ht 5\' 6"  (1.676 m)  Wt 108.863 kg  BMI 38.76 kg/m2  SpO2 100%   Physical Exam  Constitutional: She is oriented to person, place, and time. She appears well-developed and well-nourished.  HENT:  Head: Normocephalic and atraumatic.  Eyes: Conjunctivae are normal. Pupils are equal, round, and reactive to light. Right eye exhibits no discharge. Left eye exhibits no discharge. No scleral icterus.  Neck: Normal range of motion. No JVD present. No tracheal deviation present.  Pulmonary/Chest:  Effort normal. No stridor.  Abdominal: Soft. She exhibits no distension and no mass. There is tenderness. There is no rebound and no guarding.  Very minimal TTP, not consistently reproducible  Neurological: She is alert and oriented to person, place, and time. Coordination normal.  Psychiatric: She has a normal mood and affect. Her behavior is normal. Judgment and thought content normal.  Nursing note and vitals reviewed.  ED Course  Procedures (including critical care time) Labs Review Labs Reviewed  COMPREHENSIVE METABOLIC PANEL - Abnormal; Notable for the following:    CO2 20 (*)    Glucose, Bld 112 (*)    Total Bilirubin 0.2 (*)    All other components within normal limits  CBC - Abnormal; Notable for the following:    Platelets 405 (*)  All other components within normal limits  URINALYSIS, ROUTINE W REFLEX MICROSCOPIC (NOT AT [facility]) - Abnormal; Notable for the following:    APPearance CLOUDY (*)    All other components within normal limits  LIPASE, BLOOD    Imaging Review No results found. I have personally reviewed and evaluated these images and lab results as part of my medical decision-making.   EKG Interpretation None      MDM   Final diagnoses:  Left lower quadrant pain    Labs: Lipase, CMP, CBC, urinalysis  Imaging:  Consults:  Therapeutics:  Discharge Meds:   Assessment/Plan: 35 year old female presents today with complaints of abdominal pain. Patient is afebrile and nontoxic in no acute distress. She has no significant findings on laboratory analysis, nonfocal abdominal exam, no signs or symptoms to necessitate further evaluation or management here in the ED. Uncertain etiology of patient's pain today. Patient reported she had no previous history of abdominal pain, was seen several days ago for abdominal discomfort. Patient has had 4 visits in the last month with 2 CT scans. I do not feel that she requires further imaging at this time. Case management  consultation as patient is new to the area and does not have primary care. [facility] Patient is given strict return precautions, she verbalized understanding and agreement today's plan had no further questions concerns at time of discharge.       Eyvonne MechanicJeffrey Gurfateh Mcclain, PA-C 10/17/15 1937  Mancel BaleElliott Wentz, MD 10/18/15 1135

## 2015-10-17 NOTE — ED Notes (Addendum)
Pt c/o LUQ pain and n/v x 8 days.  Pain score 9/10.  Pt reports taking baclofen w/o relief.  [facility].  Sts "it's actually gotten worse."   Pt reports appointment w/ PCP in 2 weeks.

## 2015-11-16 ENCOUNTER — Emergency Department (HOSPITAL_COMMUNITY)
Admission: EM | Admit: 2015-11-16 | Discharge: 2015-11-16 | Disposition: A | Discharge status: Home / Self Care | Payer: Medicaid - Out of State | Attending: Emergency Medicine | Admitting: Emergency Medicine

## 2015-11-16 ENCOUNTER — Encounter (HOSPITAL_COMMUNITY): Payer: Self-pay | Admitting: Emergency Medicine

## 2015-11-16 DIAGNOSIS — E119 Type 2 diabetes mellitus without complications: Secondary | ICD-10-CM | POA: Insufficient documentation

## 2015-11-16 DIAGNOSIS — Z7984 Long term (current) use of oral hypoglycemic drugs: Secondary | ICD-10-CM | POA: Insufficient documentation

## 2015-11-16 DIAGNOSIS — R109 Unspecified abdominal pain: Secondary | ICD-10-CM

## 2015-11-16 DIAGNOSIS — Z79899 Other long term (current) drug therapy: Secondary | ICD-10-CM | POA: Insufficient documentation

## 2015-11-16 DIAGNOSIS — I10 Essential (primary) hypertension: Secondary | ICD-10-CM | POA: Insufficient documentation

## 2015-11-16 DIAGNOSIS — R1012 Left upper quadrant pain: Secondary | ICD-10-CM | POA: Insufficient documentation

## 2015-11-16 LAB — CBC WITH DIFFERENTIAL/PLATELET
BASOS ABS: 0 10*3/uL (ref 0.0–0.1)
BASOS PCT: 0 %
EOS ABS: 0.2 10*3/uL (ref 0.0–0.7)
EOS PCT: 2 %
HEMATOCRIT: 38.6 % (ref 36.0–46.0)
Hemoglobin: 12.1 g/dL (ref 12.0–15.0)
Lymphocytes Relative: 20 %
Lymphs Abs: 2.1 10*3/uL (ref 0.7–4.0)
MCH: 28.8 pg (ref 26.0–34.0)
MCHC: 31.3 g/dL (ref 30.0–36.0)
MCV: 91.9 fL (ref 78.0–100.0)
MONO ABS: 0.7 10*3/uL (ref 0.1–1.0)
MONOS PCT: 6 %
Neutro Abs: 7.6 10*3/uL (ref 1.7–7.7)
Neutrophils Relative %: 72 %
Platelets: 391 10*3/uL (ref 150–400)
RBC: 4.2 MIL/uL (ref 3.87–5.11)
RDW: 13.6 % (ref 11.5–15.5)
WBC: 10.7 10*3/uL — ABNORMAL HIGH (ref 4.0–10.5)

## 2015-11-16 LAB — URINALYSIS, ROUTINE W REFLEX MICROSCOPIC
BILIRUBIN URINE: NEGATIVE
GLUCOSE, UA: NEGATIVE mg/dL
HGB URINE DIPSTICK: NEGATIVE
KETONES UR: NEGATIVE mg/dL
LEUKOCYTES UA: NEGATIVE
Nitrite: NEGATIVE
PH: 7.5 (ref 5.0–8.0)
PROTEIN: NEGATIVE mg/dL
Specific Gravity, Urine: 1.017 (ref 1.005–1.030)

## 2015-11-16 LAB — COMPREHENSIVE METABOLIC PANEL
ALK PHOS: 145 U/L — AB (ref 38–126)
ALT: 15 U/L (ref 14–54)
AST: 16 U/L (ref 15–41)
Albumin: 3.8 g/dL (ref 3.5–5.0)
Anion gap: 6 (ref 5–15)
BUN: 13 mg/dL (ref 6–20)
CALCIUM: 9.3 mg/dL (ref 8.9–10.3)
CO2: 23 mmol/L (ref 22–32)
CREATININE: 1.05 mg/dL — AB (ref 0.44–1.00)
Chloride: 110 mmol/L (ref 101–111)
Glucose, Bld: 120 mg/dL — ABNORMAL HIGH (ref 65–99)
Potassium: 4 mmol/L (ref 3.5–5.1)
SODIUM: 139 mmol/L (ref 135–145)
Total Bilirubin: 0.4 mg/dL (ref 0.3–1.2)
Total Protein: 6.6 g/dL (ref 6.5–8.1)

## 2015-11-16 NOTE — ED Triage Notes (Signed)
Pt c/o left flank and left lower abd pain, onset earlier today.  Also c/o pain with urination and urgency.  Pt st's she has had urinary problems in the past and has had to cath herself.

## 2015-11-16 NOTE — Discharge Instructions (Signed)
Continue your current treatments at home.  Use Tylenol for pain.  It may also help to use a heating pad on the sore areas of your left flank.  Use the phone number on the instructions to help you find a primary care doctor. They will be able to help you with many of your medical problems.

## 2015-11-16 NOTE — ED Provider Notes (Signed)
[facility]-EMERGENCY DEPT Provider Note   CSN: [id number] Arrival date & time: 11/16/15  2116  First Provider Contact:  First MD Initiated Contact with Patient 11/16/15 2232        History   Chief Complaint Chief Complaint  Patient presents with  . Abdominal Pain    HPI Brenda Hansen is a 35 y.o. female.  She presents for evaluation of left flank pain radiating to left abdomen, associated with urinary urgency, and sensation of fullness in her urinary tract. 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 She is unaware of the specific disorder that requires her to do self catheterizations. She is transitioning from [place], [place], to live here in [place], [place]. He does not have any physicians yet here in [place]. She is had several ED evaluations since arriving here, about 6 weeks ago. She denies fever, chills, nausea, vomiting, cough, shortness of breath or chest pain. There are no other no modifying factors.  HPI  Past Medical History:  Diagnosis Date  . Diabetes mellitus without complication (HCC)   . High cholesterol   . Hypertension     There are no active problems to display for this patient.   Past Surgical History:  Procedure Laterality Date  . ABDOMINAL HYSTERECTOMY    . APPENDECTOMY    . CHOLECYSTECTOMY    . TONSILLECTOMY      OB History    Gravida Para Term Preterm AB Living   0 0 0 0 0     SAB TAB Ectopic Multiple Live Births   0 0 0           Home Medications    Prior to Admission medications   Medication Sig Start Date End Date Taking? Authorizing Provider  albuterol (PROVENTIL HFA;VENTOLIN HFA) 108 (90 Base) MCG/ACT inhaler Inhale 2 puffs into the lungs every 4 (four) hours as needed for wheezing or shortness of breath.    Historical Provider, MD  beclomethasone (QVAR) 40 MCG/ACT inhaler Inhale 2 puffs into the lungs 2 (two) times daily.    Historical Provider, MD  diazepam (VALIUM) 10 MG tablet Place 10 mg vaginally  at bedtime as needed (bladder spasms.).    Historical Provider, MD  dicyclomine (BENTYL) 20 MG tablet Take 20 mg by mouth 3 (three) times daily before meals.    Historical Provider, MD  EPINEPHrine (EPIPEN 2-PAK) 0.3 mg/0.3 mL IJ SOAJ injection Inject 0.3 mg into the muscle once.    Historical Provider, MD  famotidine (PEPCID) 40 MG tablet Take 40 mg by mouth daily.    Historical Provider, MD  flavoxATE (URISPAS) 100 MG tablet Take 100 mg by mouth 3 (three) times daily.    Historical Provider, MD  fluticasone (FLONASE) 50 MCG/ACT nasal spray Place 1 spray into both nostrils daily.    Historical Provider, MD  gabapentin (NEURONTIN) 300 MG capsule Take 600 mg by mouth 3 (three) times daily.    Historical Provider, MD  hydrochlorothiazide (HYDRODIURIL) 25 MG tablet Take 25 mg by mouth daily.    Historical Provider, MD  hydrOXYzine (ATARAX/VISTARIL) 25 MG tablet Take 25 mg by mouth at bedtime as needed for anxiety.    Historical Provider, MD  lipase/protease/amylase (CREON) 36000 UNITS CPEP capsule Take 36,000 Units by mouth 3 (three) times daily with meals.    Historical Provider, MD  loratadine (CLARITIN) 10 MG tablet Take 10 mg by mouth 2 (two) times daily.    Historical Provider, MD  losartan (COZAAR) 100 MG tablet Take 100 mg by mouth  daily.    Historical Provider, MD  meclizine (ANTIVERT) 25 MG tablet Take 25 mg by mouth 3 (three) times daily.    Historical Provider, MD  metFORMIN (GLUCOPHAGE) 1000 MG tablet Take 1,000 mg by mouth 2 (two) times daily with a meal.    Historical Provider, MD  methocarbamol (ROBAXIN-750) 750 MG tablet Take 2 tablets (1,500 mg total) by mouth 4 (four) times daily. Patient not taking: Reported on 10/09/2015 09/21/15   Joni Reining, PA-C  methylPREDNISolone (MEDROL DOSEPAK) 4 MG TBPK tablet Take Tapered dose as directed Patient not taking: Reported on 10/09/2015 09/21/15   Joni Reining, PA-C  metoprolol succinate (TOPROL-XL) 100 MG 24 hr tablet Take 150 mg by mouth 2  (two) times daily. Take with or immediately following a meal.    Historical Provider, MD  nortriptyline (PAMELOR) 25 MG capsule Take 25 mg by mouth at bedtime.    Historical Provider, MD  omeprazole (PRILOSEC) 20 MG capsule Take 20 mg by mouth daily.    Historical Provider, MD  ondansetron (ZOFRAN) 4 MG tablet Take 1 tablet (4 mg total) by mouth every 6 (six) hours. 10/17/15   Eyvonne Mechanic, PA-C  pantoprazole (PROTONIX) 40 MG tablet Take 40 mg by mouth daily.    Historical Provider, MD  promethazine (PHENERGAN) 25 MG tablet Take 1 tablet (25 mg total) by mouth every 8 (eight) hours as needed for nausea or vomiting. 10/10/15   Charlestine Night, PA-C  simvastatin (ZOCOR) 40 MG tablet Take 40 mg by mouth daily at 6 PM.    Historical Provider, MD  sucralfate (CARAFATE) 1 g tablet Take 1 g by mouth 4 (four) times daily -  with meals and at bedtime.    Historical Provider, MD  sulfamethoxazole-trimethoprim (BACTRIM DS,SEPTRA DS) 800-160 MG tablet Take 1 tablet by mouth 2 (two) times daily. For 14 days, then off 14 days.  Repeat as directed.    Historical Provider, MD  tiotropium (SPIRIVA) 18 MCG inhalation capsule Place 18 mcg into inhaler and inhale daily.    Historical Provider, MD  traMADol (ULTRAM) 50 MG tablet Take 1 tablet (50 mg total) by mouth every 6 (six) hours as needed for severe pain. Patient not taking: Reported on 10/17/2015 10/10/15   Charlestine Night, PA-C    Family History No family history on file.  Social History Social History  Substance Use Topics  . Smoking status: Never Smoker  . Smokeless tobacco: Never Used  . Alcohol use No     Allergies   Iodine; Penicillins; Iodine; Nsaids; Nsaids; and Penicillins   Review of Systems Review of Systems  All other systems reviewed and are negative.    Physical Exam Updated Vital Signs BP 136/92 (BP Location: Left Arm)   Pulse 84   Temp 98.2 F (36.8 C) (Oral)   Resp 16   Ht 5\' 6"  (1.676 m)   Wt 223 lb 1 oz (101.2 kg)    SpO2 100%   BMI 36.00 kg/m   Physical Exam  Constitutional: She is oriented to person, place, and time. She appears well-developed. She appears distressed (She is distracted).  Obese  HENT:  Head: Normocephalic and atraumatic.  Eyes: Conjunctivae and EOM are normal. Pupils are equal, round, and reactive to light.  Neck: Normal range of motion and phonation normal. Neck supple.  Cardiovascular: Normal rate and regular rhythm.   Pulmonary/Chest: Effort normal and breath sounds normal. She exhibits no tenderness.  Abdominal: Soft. She exhibits no distension and no mass. There  is tenderness (Mild left upper quadrant tenderness to palpation.). There is no guarding.  Genitourinary:  Genitourinary Comments: No costovertebral angle tenderness with percussion.  Musculoskeletal: Normal range of motion.  Mild left lumbar tenderness to light palpation.  Neurological: She is alert and oriented to person, place, and time. She exhibits normal muscle tone.  Skin: Skin is warm and dry.  Psychiatric: She has a normal mood and affect. Her behavior is normal. Judgment and thought content normal.  Nursing note and vitals reviewed.    ED Treatments / Results  Labs (all labs ordered are listed, but only abnormal results are displayed) Labs Reviewed  URINALYSIS, ROUTINE W REFLEX MICROSCOPIC (NOT AT [facility]) - Abnormal; Notable for the following:       Result Value   APPearance CLOUDY (*)    All other components within normal limits  CBC WITH DIFFERENTIAL/PLATELET - Abnormal; Notable for the following:    WBC 10.7 (*)    All other components within normal limits  COMPREHENSIVE METABOLIC PANEL - Abnormal; Notable for the following:    Glucose, Bld 120 (*)    Creatinine, Ser 1.05 (*)    Alkaline Phosphatase 145 (*)    All other components within normal limits    EKG  EKG Interpretation None       Radiology No results found.  Procedures Procedures (including critical care  time)  Medications Ordered in ED Medications - No data to display   Initial Impression / Assessment and Plan / ED Course  I have reviewed the triage vital signs and the nursing notes.  Pertinent labs & imaging results that were available during my care of the patient were reviewed by me and considered in my medical decision making (see chart for details).  Clinical Course    Medications - No data to display  Patient Vitals for the past 24 hrs:  BP Temp Temp src Pulse Resp SpO2 Height Weight  11/16/15 2136 136/92 98.2 F (36.8 C) Oral 84 16 100 % 5\' 6"  (1.676 m) 223 lb 1 oz (101.2 kg)    11:07 PM Reevaluation with update and discussion. After initial assessment and treatment, an updated evaluation reveals No additional complaints. Findings discussed with patient, all questions answered. Alexzia Kasler L    Final Clinical Impressions(s) / ED Diagnoses   Final diagnoses:  Left flank pain     Nonspecific pain, without findings for acute infectious or metabolic process. Several recent visits here for a multitude of problems. Doubt UTI, metabolic instability or impending vascular collapse.  Nursing Notes Reviewed/ Care Coordinated Applicable Imaging Reviewed Interpretation of Laboratory Data incorporated into ED treatment  The patient appears reasonably screened and/or stabilized for discharge and I doubt any other medical condition or other [facility] requiring further screening, evaluation, or treatment in the ED at this time prior to discharge.  Plan: Home Medications- continue, use Tylenol for pain; Home Treatments- rest self catheter when necessary; return here if the recommended treatment, does not improve the symptoms; Recommended follow up- PCP and urology follow-up, next week    New Prescriptions New Prescriptions   No medications on file     Mancel Bale, MD 11/16/15 2308

## 2015-12-18 ENCOUNTER — Encounter (HOSPITAL_COMMUNITY): Payer: Self-pay | Admitting: *Deleted

## 2015-12-18 ENCOUNTER — Emergency Department (HOSPITAL_COMMUNITY)
Admission: EM | Admit: 2015-12-18 | Discharge: 2015-12-19 | Disposition: A | Discharge status: Home / Self Care | Payer: Medicaid - Out of State | Attending: Emergency Medicine | Admitting: Emergency Medicine

## 2015-12-18 ENCOUNTER — Emergency Department (HOSPITAL_COMMUNITY): Payer: Medicaid - Out of State

## 2015-12-18 DIAGNOSIS — W201XXA Struck by object due to collapse of building, initial encounter: Secondary | ICD-10-CM | POA: Insufficient documentation

## 2015-12-18 DIAGNOSIS — Z7984 Long term (current) use of oral hypoglycemic drugs: Secondary | ICD-10-CM | POA: Insufficient documentation

## 2015-12-18 DIAGNOSIS — Y9389 Activity, other specified: Secondary | ICD-10-CM | POA: Insufficient documentation

## 2015-12-18 DIAGNOSIS — S60221A Contusion of right hand, initial encounter: Secondary | ICD-10-CM | POA: Insufficient documentation

## 2015-12-18 DIAGNOSIS — E119 Type 2 diabetes mellitus without complications: Secondary | ICD-10-CM | POA: Insufficient documentation

## 2015-12-18 DIAGNOSIS — Y999 Unspecified external cause status: Secondary | ICD-10-CM | POA: Insufficient documentation

## 2015-12-18 DIAGNOSIS — S60211A Contusion of right wrist, initial encounter: Secondary | ICD-10-CM | POA: Insufficient documentation

## 2015-12-18 DIAGNOSIS — Y929 Unspecified place or not applicable: Secondary | ICD-10-CM | POA: Insufficient documentation

## 2015-12-18 DIAGNOSIS — I10 Essential (primary) hypertension: Secondary | ICD-10-CM | POA: Insufficient documentation

## 2015-12-18 LAB — COMPREHENSIVE METABOLIC PANEL
ALK PHOS: 71 U/L (ref 38–126)
ALT: 14 U/L (ref 14–54)
AST: 18 U/L (ref 15–41)
Albumin: 3.9 g/dL (ref 3.5–5.0)
Anion gap: 7 (ref 5–15)
BUN: 13 mg/dL (ref 6–20)
CALCIUM: 9.3 mg/dL (ref 8.9–10.3)
CO2: 25 mmol/L (ref 22–32)
CREATININE: 0.96 mg/dL (ref 0.44–1.00)
Chloride: 109 mmol/L (ref 101–111)
Glucose, Bld: 121 mg/dL — ABNORMAL HIGH (ref 65–99)
Potassium: 3.5 mmol/L (ref 3.5–5.1)
Sodium: 141 mmol/L (ref 135–145)
Total Bilirubin: 0.4 mg/dL (ref 0.3–1.2)
Total Protein: 6.8 g/dL (ref 6.5–8.1)

## 2015-12-18 LAB — CBC
HCT: 39 % (ref 36.0–46.0)
Hemoglobin: 12.3 g/dL (ref 12.0–15.0)
MCH: 29.1 pg (ref 26.0–34.0)
MCHC: 31.5 g/dL (ref 30.0–36.0)
MCV: 92.4 fL (ref 78.0–100.0)
PLATELETS: 408 10*3/uL — AB (ref 150–400)
RBC: 4.22 MIL/uL (ref 3.87–5.11)
RDW: 13.7 % (ref 11.5–15.5)
WBC: 10.3 10*3/uL (ref 4.0–10.5)

## 2015-12-18 LAB — LIPASE, BLOOD: Lipase: 70 U/L — ABNORMAL HIGH (ref 11–51)

## 2015-12-18 LAB — URINALYSIS, ROUTINE W REFLEX MICROSCOPIC
Bilirubin Urine: NEGATIVE
Glucose, UA: NEGATIVE mg/dL
HGB URINE DIPSTICK: NEGATIVE
KETONES UR: NEGATIVE mg/dL
LEUKOCYTES UA: NEGATIVE
Nitrite: NEGATIVE
PROTEIN: NEGATIVE mg/dL
Specific Gravity, Urine: 1.024 (ref 1.005–1.030)
pH: 6 (ref 5.0–8.0)

## 2015-12-18 MED ORDER — HYDROCODONE-ACETAMINOPHEN 5-325 MG PO TABS
2.0000 | ORAL_TABLET | Freq: Once | ORAL | Status: AC
  Administered 2015-12-18: 2 via ORAL
  Filled 2015-12-18: qty 2

## 2015-12-18 MED ORDER — TRAMADOL HCL 50 MG PO TABS: 50.0000 mg | ORAL_TABLET | Freq: Two times a day (BID) | ORAL | 0 refills | Status: AC | PRN

## 2015-12-18 NOTE — ED Triage Notes (Signed)
Ring removed from her rt ring finger

## 2015-12-18 NOTE — ED Provider Notes (Signed)
[facility]-EMERGENCY DEPT Provider Note   CSN: [id number] Arrival date & time: 12/18/15  1933     History   Chief Complaint Chief Complaint  Patient presents with  . Arm Injury  . Hematuria    HPI Brenda Hansen is a 35 y.o. female.  HPI   Patient is a 35 year old female with history of neurogenic bladder, status post low back injury, hypertension, diabetes, who presents emergency Department with complaint of right hand and right wrist pain that occurred just prior to arrival when he rammed into by fours fell on top of her hand.  She has 8/10 and sharp pain which radiates halfway up her right forearm, constant since her injury, worse with movement or palpation. She endorses bruising and swelling to the dorsum of her right hand, and decreased range of motion and strength secondary to pain.   She has no areas of erythema, abrasion or laceration. No numbness, tingling.  She denies any right arm or right shoulder pain, denies any other injuries, no head trauma, no loss of consciousness.   patient also complains of hematuria today when she is a self catheterization. She had a fall injury with low back damage which left her with neurogenic bladder and she uses in out catheterization possibly 3 times a day.  She is unable to void otherwise. She's been doing this for personally 3 years. She gets a UTI roughly every 3 months. [place] to the area from [place] and is due to see urology. She did call them today when she had blood in her urine and they scheduled her for an appointment in one week.  She requests evaluation for this today.  She has no dysuria, flank pain, abdominal pain, nausea, vomiting, fever.  She has not catheterized again since the incident earlier today with hematuria.    Past Medical History:  Diagnosis Date  . Diabetes mellitus without complication (HCC)   . High cholesterol   . Hypertension     There are no active problems to display for this patient.   Past  Surgical History:  Procedure Laterality Date  . ABDOMINAL HYSTERECTOMY    . APPENDECTOMY    . CHOLECYSTECTOMY    . TONSILLECTOMY      OB History    Gravida Para Term Preterm AB Living   0 0 0 0 0     SAB TAB Ectopic Multiple Live Births   0 0 0           Home Medications    Prior to Admission medications   Medication Sig Start Date End Date Taking? Authorizing Provider  albuterol (PROVENTIL HFA;VENTOLIN HFA) 108 (90 Base) MCG/ACT inhaler Inhale 2 puffs into the lungs every 4 (four) hours as needed for wheezing or shortness of breath.   Yes Historical Provider, MD  beclomethasone (QVAR) 40 MCG/ACT inhaler Inhale 2 puffs into the lungs 2 (two) times daily.   Yes Historical Provider, MD  Cholecalciferol (VITAMIN D PO) Take 1 tablet by mouth daily.   Yes Historical Provider, MD  Cyanocobalamin (VITAMIN B-12 PO) Take 1 tablet by mouth daily.   Yes Historical Provider, MD  diazepam (VALIUM) 10 MG tablet Place 10 mg vaginally at bedtime as needed (bladder spasms.).   Yes Historical Provider, MD  dicyclomine (BENTYL) 20 MG tablet Take 20 mg by mouth 3 (three) times daily before meals.   Yes Historical Provider, MD  famotidine (PEPCID) 40 MG tablet Take 40 mg by mouth daily.   Yes Historical Provider, MD  flavoxATE (URISPAS) 100 MG tablet Take 100 mg by mouth 3 (three) times daily.   Yes Historical Provider, MD  fluticasone (FLONASE) 50 MCG/ACT nasal spray Place 1 spray into both nostrils daily.   Yes Historical Provider, MD  gabapentin (NEURONTIN) 300 MG capsule Take 600 mg by mouth 3 (three) times daily.   Yes Historical Provider, MD  hydrochlorothiazide (HYDRODIURIL) 25 MG tablet Take 25 mg by mouth daily.   Yes Historical Provider, MD  hydrOXYzine (ATARAX/VISTARIL) 25 MG tablet Take 25 mg by mouth at bedtime as needed for anxiety.   Yes Historical Provider, MD  lipase/protease/amylase (CREON) 36000 UNITS CPEP capsule Take 36,000 Units by mouth 3 (three) times daily with meals.   Yes  Historical Provider, MD  loratadine (CLARITIN) 10 MG tablet Take 10 mg by mouth 2 (two) times daily.   Yes Historical Provider, MD  losartan (COZAAR) 100 MG tablet Take 100 mg by mouth daily.   Yes Historical Provider, MD  meclizine (ANTIVERT) 25 MG tablet Take 25 mg by mouth 3 (three) times daily.   Yes Historical Provider, MD  metFORMIN (GLUCOPHAGE) 1000 MG tablet Take 1,000 mg by mouth 2 (two) times daily with a meal.   Yes Historical Provider, MD  metoprolol succinate (TOPROL-XL) 100 MG 24 hr tablet Take 150 mg by mouth 2 (two) times daily. Take with or immediately following a meal.   Yes Historical Provider, MD  nortriptyline (PAMELOR) 25 MG capsule Take 25 mg by mouth at bedtime.   Yes Historical Provider, MD  omeprazole (PRILOSEC) 20 MG capsule Take 20 mg by mouth daily.   Yes Historical Provider, MD  pantoprazole (PROTONIX) 40 MG tablet Take 40 mg by mouth daily.   Yes Historical Provider, MD  simvastatin (ZOCOR) 40 MG tablet Take 40 mg by mouth daily at 6 PM.   Yes Historical Provider, MD  SUMAtriptan (IMITREX) 100 MG tablet Take 100 mg by mouth every 2 (two) hours as needed for migraine. May repeat in 2 hours if headache persists or recurs.   Yes Historical Provider, MD  SUMAtriptan (IMITREX) 6 MG/0.5ML SOLN injection Inject 6 mg into the skin every 2 (two) hours as needed for migraine or headache. May repeat in 2 hours if headache persists or recurs. Patient states she also takes the pill form of imitrex as needed for migraine   Yes Historical Provider, MD  tiotropium (SPIRIVA) 18 MCG inhalation capsule Place 18 mcg into inhaler and inhale daily.   Yes Historical Provider, MD  topiramate (TOPAMAX) 200 MG tablet Take 200 mg by mouth 2 (two) times daily.   Yes Historical Provider, MD  EPINEPHrine (EPIPEN 2-PAK) 0.3 mg/0.3 mL IJ SOAJ injection Inject 0.3 mg into the muscle once.    Historical Provider, MD  methocarbamol (ROBAXIN-750) 750 MG tablet Take 2 tablets (1,500 mg total) by mouth 4  (four) times daily. Patient not taking: Reported on 10/09/2015 09/21/15   Joni Reining, PA-C  methylPREDNISolone (MEDROL DOSEPAK) 4 MG TBPK tablet Take Tapered dose as directed Patient not taking: Reported on 10/09/2015 09/21/15   Joni Reining, PA-C  ondansetron (ZOFRAN) 4 MG tablet Take 1 tablet (4 mg total) by mouth every 6 (six) hours. Patient not taking: Reported on 12/18/2015 10/17/15   Eyvonne Mechanic, PA-C  promethazine (PHENERGAN) 25 MG tablet Take 1 tablet (25 mg total) by mouth every 8 (eight) hours as needed for nausea or vomiting. Patient not taking: Reported on 12/18/2015 10/10/15   Charlestine Night, PA-C  traMADol (ULTRAM) 50 MG  tablet Take 1 tablet (50 mg total) by mouth every 12 (twelve) hours as needed for severe pain. 12/18/15   Danelle Berry, PA-C    Family History No family history on file.  Social History Social History  Substance Use Topics  . Smoking status: Never Smoker  . Smokeless tobacco: Never Used  . Alcohol use No     Allergies   Iodine; Penicillins; Iodine; Nsaids; Nsaids; Orange (diagnostic); Pecan extract allergy skin test; Penicillins; and Shellfish allergy   Review of Systems Review of Systems  All other systems reviewed and are negative.    Physical Exam Updated Vital Signs BP 132/99 (BP Location: Left Arm)   Pulse 92   Temp 98 F (36.7 C) (Oral)   Resp 20   SpO2 100%   Physical Exam  Constitutional: She is oriented to person, place, and time. She appears well-developed and well-nourished. No distress.  HENT:  Head: Normocephalic and atraumatic.  Right Ear: External ear normal.  Left Ear: External ear normal.  Nose: Nose normal.  Eyes: Conjunctivae and EOM are normal. Pupils are equal, round, and reactive to light. Right eye exhibits no discharge. Left eye exhibits no discharge. No scleral icterus.  Neck: Normal range of motion. Neck supple. No JVD present. No tracheal deviation present.  Cardiovascular: Normal rate and regular rhythm.     Pulmonary/Chest: Effort normal and breath sounds normal. No stridor. No respiratory distress. She has no wheezes. She has no rales. She exhibits no tenderness.  Abdominal: Soft. Bowel sounds are normal. She exhibits no distension and no mass. There is no tenderness. There is no rebound and no guarding.  Musculoskeletal: She exhibits tenderness. She exhibits no edema or deformity.       Right wrist: She exhibits tenderness and bony tenderness. She exhibits normal range of motion, no swelling, no effusion, no deformity and no laceration.       Right hand: She exhibits decreased range of motion, tenderness and bony tenderness. She exhibits normal capillary refill, no deformity, no laceration and no swelling. Normal sensation noted. Decreased sensation is not present in the ulnar distribution, is not present in the medial distribution and is not present in the radial distribution. Decreased strength noted. She exhibits thumb/finger opposition. She exhibits no finger abduction and no wrist extension trouble.       Hands: Right wrist, dorsal aspect, and dorsum of hand with generalized tenderness to palpation, no deformity, no edema, erythema or laceration. Patient has normal wrist flexion and extension, she is able to abduct and adduct all of her fingers but she cannot do finger-thumb opposition in fingers 3, 4 and 5, secondary to pain.  Normal sensation, normal capillary refill  Lymphadenopathy:    She has no cervical adenopathy.  Neurological: She is alert and oriented to person, place, and time. She exhibits normal muscle tone. Coordination normal.  Skin: Skin is warm and dry. Capillary refill takes less than 2 seconds. No rash noted. She is not diaphoretic. No erythema. No pallor.  Psychiatric: She has a normal mood and affect. Her behavior is normal. Judgment and thought content normal.  Nursing note and vitals reviewed.    ED Treatments / Results  Labs (all labs ordered are listed, but only  abnormal results are displayed) Labs Reviewed  LIPASE, BLOOD - Abnormal; Notable for the following:       Result Value   Lipase 70 (*)    All other components within normal limits  COMPREHENSIVE METABOLIC PANEL - Abnormal; Notable for  the following:    Glucose, Bld 121 (*)    All other components within normal limits  CBC - Abnormal; Notable for the following:    Platelets 408 (*)    All other components within normal limits  URINALYSIS, ROUTINE W REFLEX MICROSCOPIC (NOT AT [facility])    EKG  EKG Interpretation None       Radiology Dg Wrist Complete Right  Result Date: 12/18/2015 CLINICAL DATA:  A wall fell on the patient today.  Wrist pain. EXAM: RIGHT WRIST - COMPLETE 3+ VIEW COMPARISON:  None. FINDINGS: There is no evidence of fracture or dislocation. There is no evidence of arthropathy or other focal bone abnormality. Soft tissues are unremarkable. IMPRESSION: Negative. Electronically Signed   By: Gaylyn Rong M.D.   On: 12/18/2015 20:26   Dg Hand Complete Right  Result Date: 12/18/2015 CLINICAL DATA:  Right hand and wrist pain after a wall fell on the patient. Reduced range of motion. Swelling. EXAM: RIGHT HAND - COMPLETE 3+ VIEW COMPARISON:  None. FINDINGS: There is no evidence of fracture or dislocation. There is no evidence of arthropathy or other focal bone abnormality. Soft tissues are unremarkable. IMPRESSION: Negative. Electronically Signed   By: Gaylyn Rong M.D.   On: 12/18/2015 20:25    Procedures Procedures (including critical care time)  Medications Ordered in ED Medications  HYDROcodone-acetaminophen (NORCO/VICODIN) 5-325 MG per tablet 2 tablet (2 tablets Oral Given 12/18/15 2328)     Initial Impression / Assessment and Plan / ED Course  I have reviewed the triage vital signs and the nursing notes.  Pertinent labs & imaging results that were available during my care of the patient were reviewed by me and considered in my medical decision making (see  chart for details).  Clinical Course   Contusion injury to right hand and wrist occurred just prior to arrival. X-rays are negative for fracture or dislocation.  Mild bruising over right dorsal metacarpals 2-5, otherwise normal in appearance, with generalized tenderness to palpation, normal sensation, normal capillary refill, some decreased range of motion of fingers secondary to pain.  Patient placed in wrist splint, encouraged conservative treatment with RICE tx.  She is allergic to NSAIDS, pain was treated in the ER, small amount of tramadol to go home with.  Urinalysis was negative, no concern for UTI or hematuria, suspect it was traumatic catheterization, she does have follow-up with urology next week.   She otherwise has no complaints, is well-appearing with stable vital signs, safe to discharge home  Final Clinical Impressions(s) / ED Diagnoses   Final diagnoses:  Contusion, hand, right, initial encounter  Contusion of right wrist, initial encounter    New Prescriptions New Prescriptions   TRAMADOL (ULTRAM) 50 MG TABLET    Take 1 tablet (50 mg total) by mouth every 12 (twelve) hours as needed for severe pain.     Danelle Berry, PA-C 12/19/15 0004    Mancel Bale, MD 12/20/15 2149

## 2015-12-18 NOTE — ED Triage Notes (Signed)
The pt is c/o two problems  She had an unfinished wall fall onto her rt wrist and forearm earlier today  She has not attempted to get her ring off her rt ring finger  She also wants to be seen for bloody urine  She self caths herself and saw blood in her urine today  lmp  None hyst

## 2015-12-19 NOTE — ED Notes (Signed)
Pt d/c home via w/c.

## 2017-06-03 IMAGING — CT CT HEAD W/O CM
3 of 8 series · 13 of 47 positions shown, 15 images · non-contrast
Comparison: None.

CLINICAL DATA: Pt fell from [HOSPITAL] this afternoon. Landed on
concrete. Denies LOC or hitting head. Landed on R side of back, R
arm, and R leg. Reports difficulty breathing at times. Pain with
movement, no obvious deformities. Pt unable to remove tongue ring

EXAM:
CT HEAD WITHOUT CONTRAST
CT CERVICAL SPINE WITHOUT CONTRAST
TECHNIQUE: Multidetector CT imaging of the head and cervical spine was
performed following the standard protocol without intravenous
contrast. Multiplanar CT image reconstructions of the cervical spine
were also generated.

[Series 3: axial recon · axial · 0.23mm/px · z∈[-312,-148]mm · 8 of 113 slices shown, 10 images]
[im 12/113  brain]
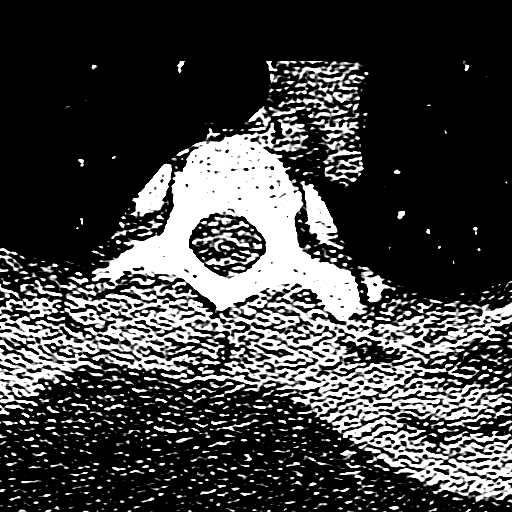
[im 12/113  bone]
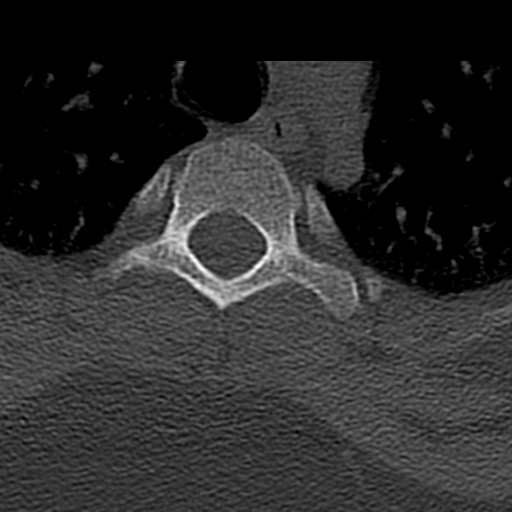
[im 23/113  brain]
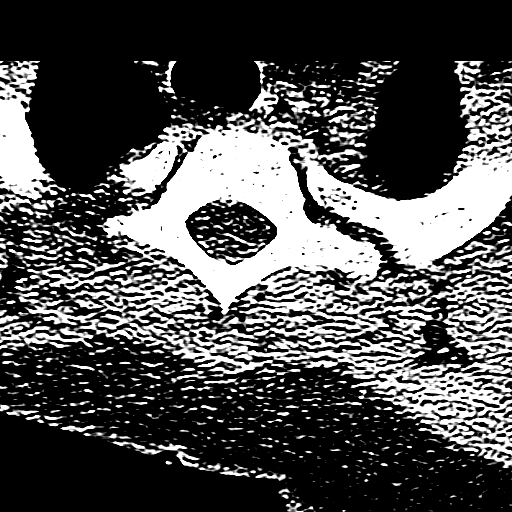
[im 34/113  brain]
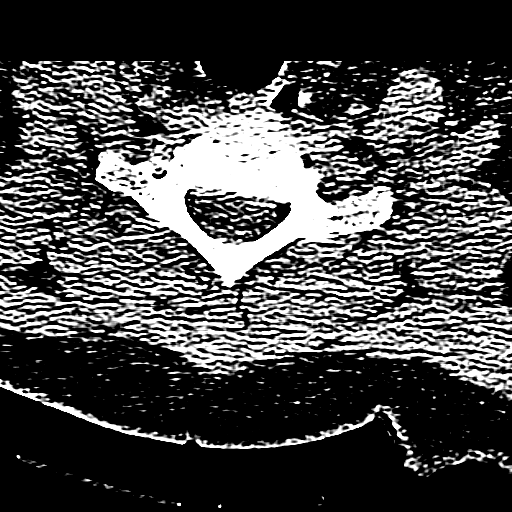
[im 45/113  brain]
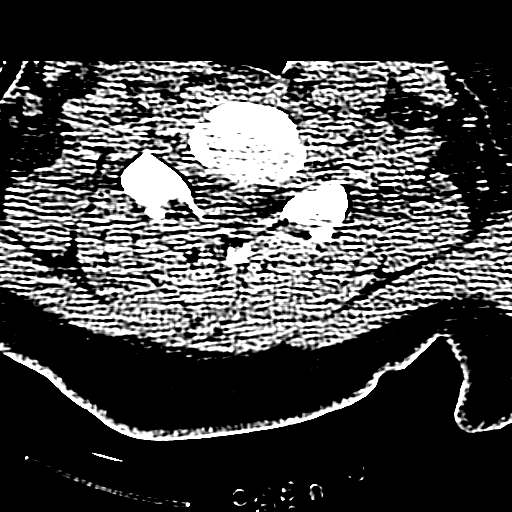
[im 68/113  brain]
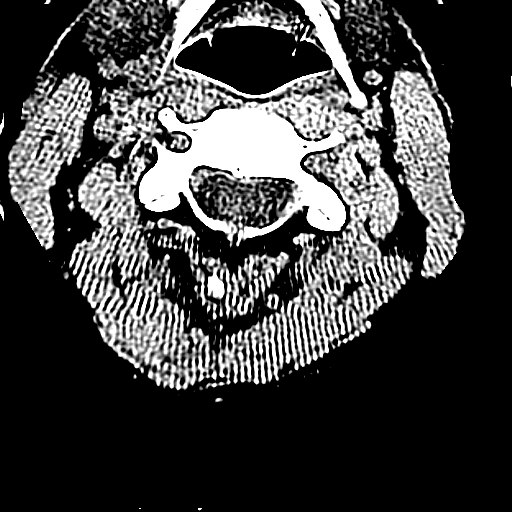
[im 68/113  bone]
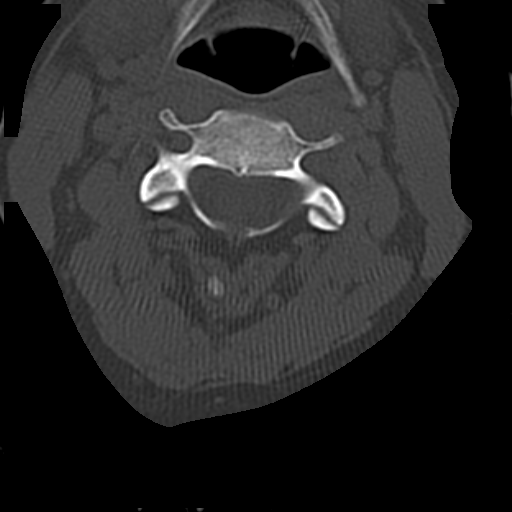
[im 79/113  brain]
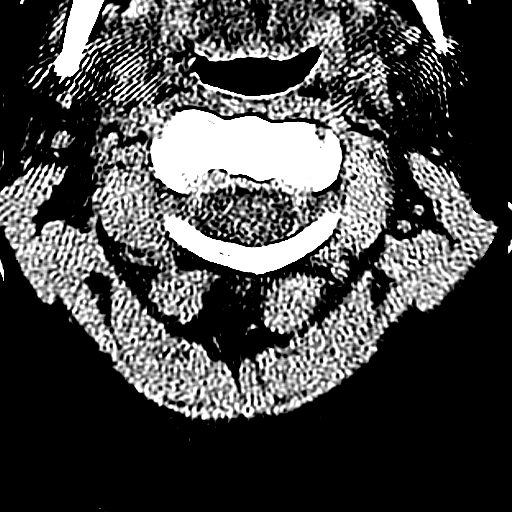
[im 90/113  brain]
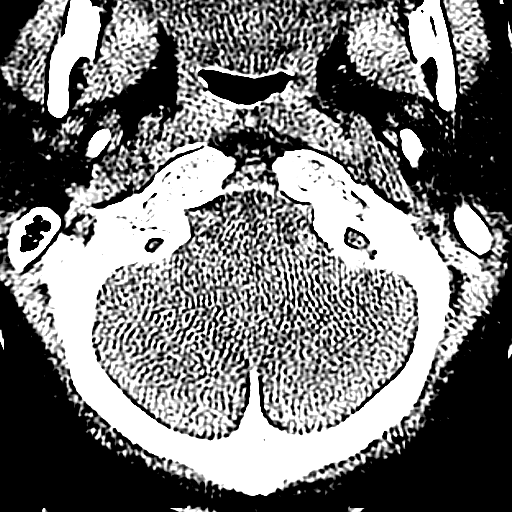
[im 101/113  brain]
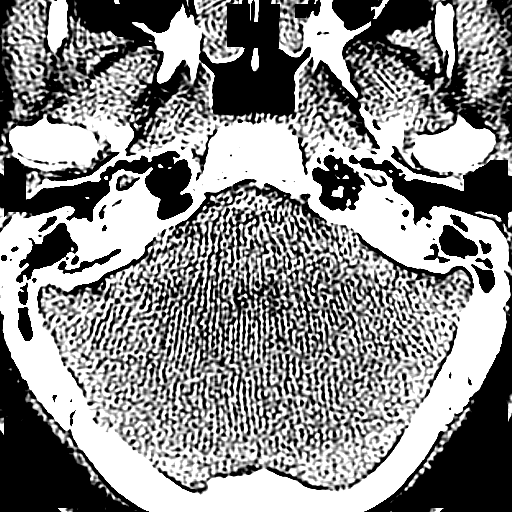

[Series 4: coronal · coronal · 0.23mm/px · 3 of 61 slices shown]
[im 26/61  brain]
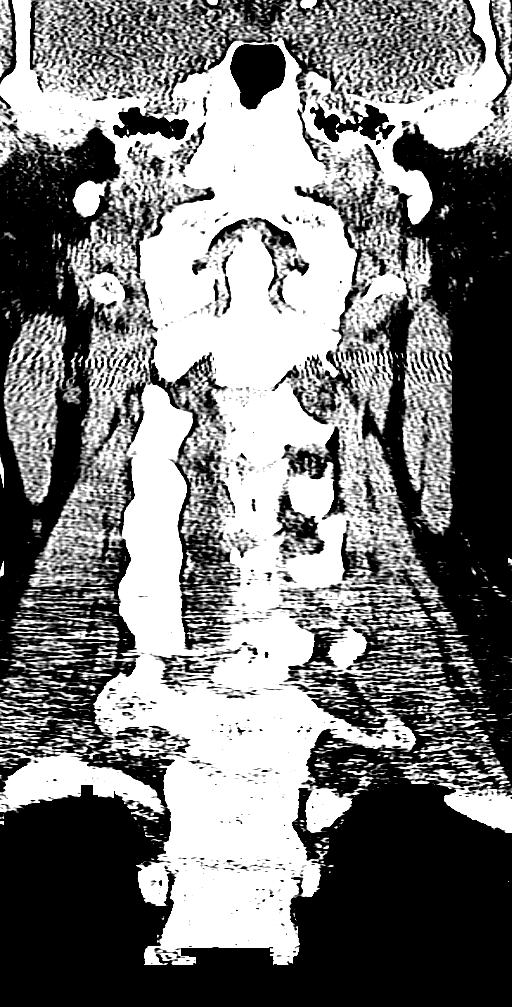
[im 37/61  brain]
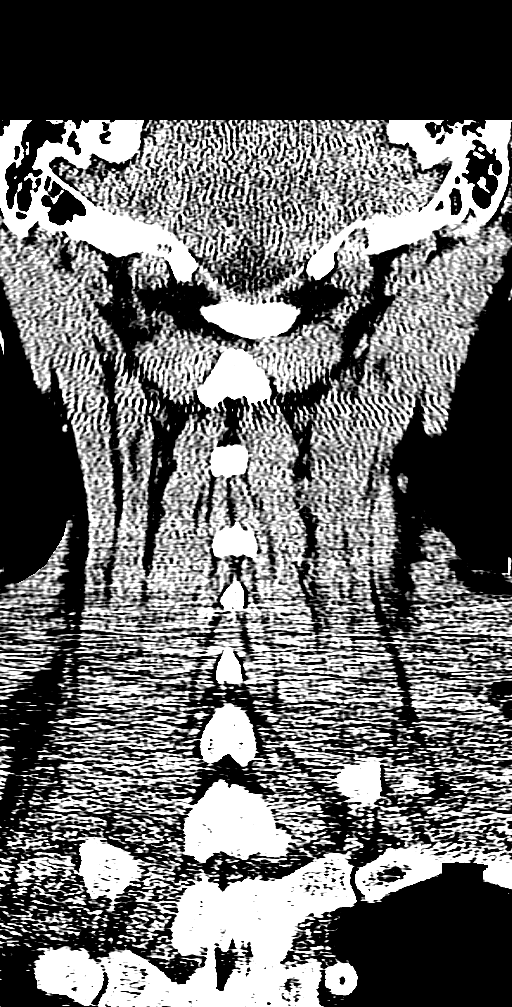
[im 49/61  brain]
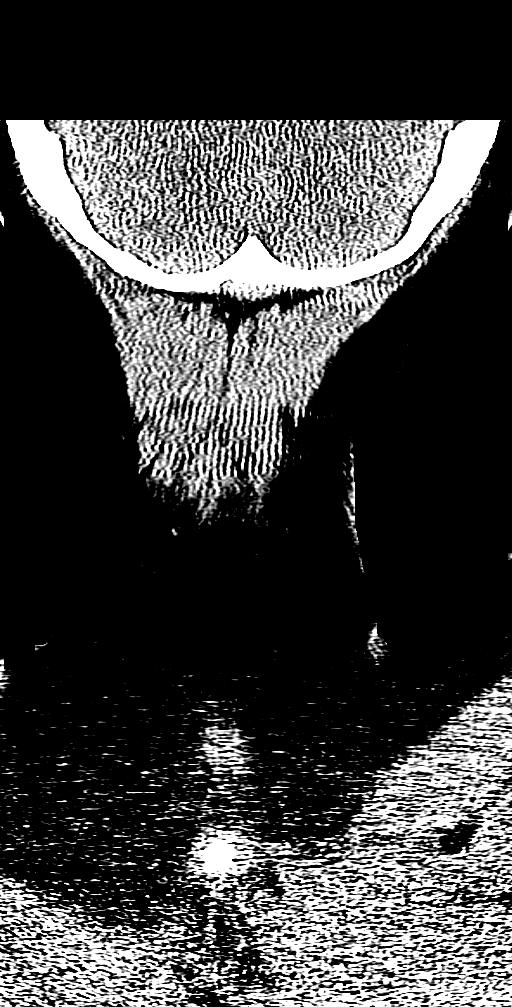

[Series 5: sagittal · sagittal · 0.22mm/px · 2 of 57 slices shown]
[im 19/57  brain]
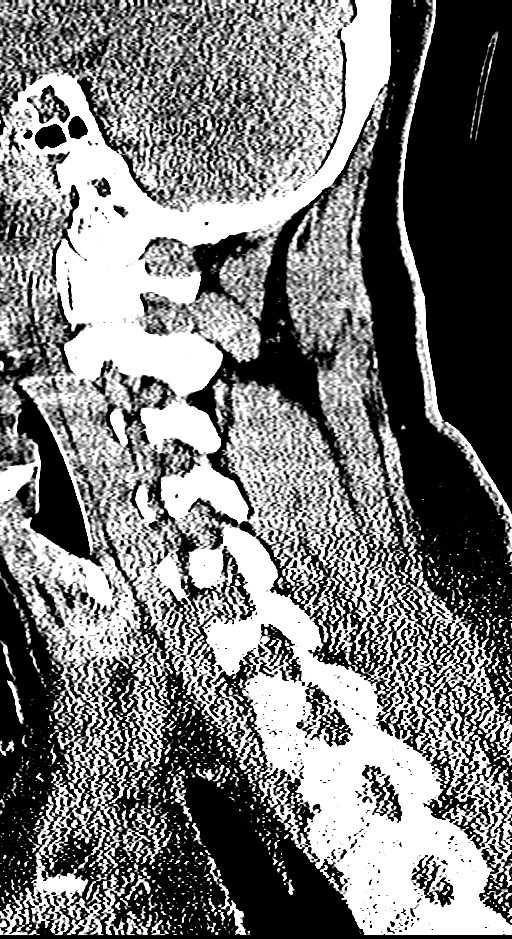
[im 38/57  brain]
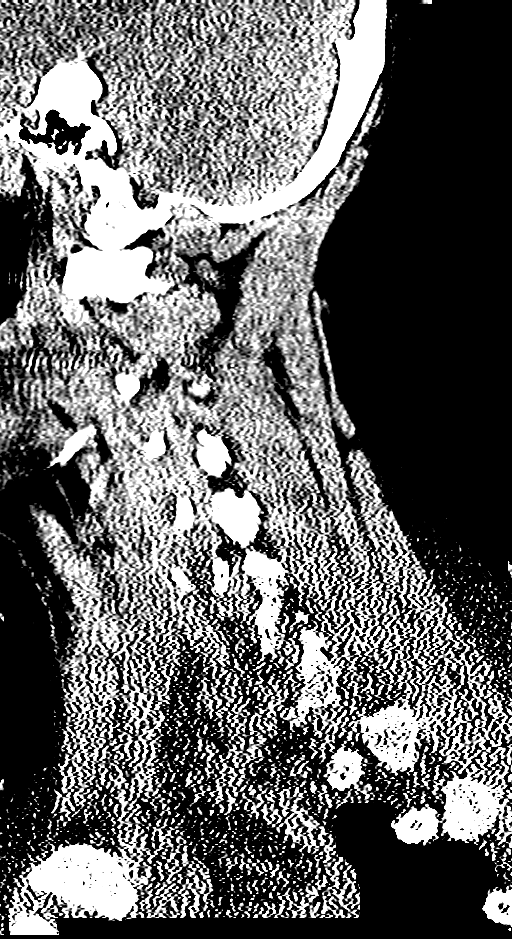

[13 of 47 positions shown; findings below may reference images not displayed]

FINDINGS: CT HEAD FINDINGS

Brain: No evidence of acute infarction, hemorrhage, extra-axial
collection, ventriculomegaly, or mass effect.

Vascular: No hyperdense vessel or unexpected calcification.

Skull: Negative for fracture or focal lesion.

Sinuses/Orbits: No acute findings.

Other: None.

CT CERVICAL SPINE FINDINGS

Negative for fracture. Vertebral body and disc height maintained
throughout. Calcifications in the posterior longitudinal ligament at
C3-4 and C4-5. No osseous foraminal stenosis. No prevertebral soft
tissue swelling. Normal alignment. Visualized lung apices clear.
IMPRESSION: 1. Negative for bleed or other acute intracranial process.
2. No acute cervical abnormality.
